# Patient Record
Sex: Male | Born: 1990 | Race: Black or African American | Hispanic: No | Marital: Single | Smoking: Current every day smoker
Health system: Southern US, Community
[De-identification: ages and names within clinical notes are randomized; demographics above are authoritative.]

## PROBLEM LIST (undated history)

## (undated) DIAGNOSIS — I609 Nontraumatic subarachnoid hemorrhage, unspecified: Secondary | ICD-10-CM

## (undated) DIAGNOSIS — W3400XA Accidental discharge from unspecified firearms or gun, initial encounter: Secondary | ICD-10-CM

---

## 2009-10-12 ENCOUNTER — Emergency Department (HOSPITAL_COMMUNITY): Admission: EM | Admit: 2009-10-12 | Discharge: 2009-10-12 | Payer: Self-pay | Admitting: Emergency Medicine

## 2013-03-15 ENCOUNTER — Emergency Department (HOSPITAL_COMMUNITY)
Admission: EM | Admit: 2013-03-15 | Discharge: 2013-03-15 | Disposition: A | Discharge status: Home / Self Care | Payer: Self-pay | Attending: Emergency Medicine | Admitting: Emergency Medicine

## 2013-03-15 ENCOUNTER — Encounter (HOSPITAL_COMMUNITY): Payer: Self-pay | Admitting: Emergency Medicine

## 2013-03-15 DIAGNOSIS — F17200 Nicotine dependence, unspecified, uncomplicated: Secondary | ICD-10-CM | POA: Insufficient documentation

## 2013-03-15 DIAGNOSIS — B9789 Other viral agents as the cause of diseases classified elsewhere: Secondary | ICD-10-CM | POA: Insufficient documentation

## 2013-03-15 DIAGNOSIS — R112 Nausea with vomiting, unspecified: Secondary | ICD-10-CM | POA: Insufficient documentation

## 2013-03-15 DIAGNOSIS — B349 Viral infection, unspecified: Secondary | ICD-10-CM

## 2013-03-15 LAB — RAPID STREP SCREEN (MED CTR MEBANE ONLY): Streptococcus, Group A Screen (Direct): NEGATIVE

## 2013-03-15 MED ORDER — ONDANSETRON HCL 4 MG PO TABS: 4.0000 mg | ORAL_TABLET | Freq: Four times a day (QID) | ORAL | Status: AC

## 2013-03-15 NOTE — ED Notes (Signed)
Pt c/o URI sx with cough and sore throat x 3 days 

## 2013-03-15 NOTE — ED Provider Notes (Signed)
CSN: [id number]     Arrival date & time 03/15/13  1248 History   First MD Initiated Contact with Patient 03/15/13 1305     Chief Complaint  Patient presents with  . Sore Throat  . URI   (Consider location/radiation/quality/duration/timing/severity/associated sxs/prior Treatment) HPI Comments: Patient presents to the emergency department with chief complaints of cough and sore throat. He states that he has been feeling sick for the past 3 days. He denies fevers, chills, diarrhea, or constipation. He states that he has had some mild nausea, as well as one episode of vomiting. He denies any hematemesis or hematochezia. States that he has not tried taking anything to alleviate his symptoms. He states that he is concerned that he might have strep throat. Nothing makes his symptoms better or worse.  The history is provided by the patient. No language interpreter was used.    History reviewed. No pertinent past medical history. History reviewed. No pertinent past surgical history. History reviewed. No pertinent family history. History  Substance Use Topics  . Smoking status: Current Every Day Smoker  . Smokeless tobacco: Not on file  . Alcohol Use: No    Review of Systems  All other systems reviewed and are negative.    Allergies  Review of patient's allergies indicates no known allergies.  Home Medications   Current Outpatient Rx  Name  Route  Sig  Dispense  Refill  . ondansetron (ZOFRAN) 4 MG tablet   Oral   Take 1 tablet (4 mg total) by mouth every 6 (six) hours.   12 tablet   0    BP 125/65  Pulse 91  Temp(Src) 98 F (36.7 C) (Oral)  Resp 20  SpO2 100% Physical Exam  Nursing note and vitals reviewed. Constitutional: He is oriented to person, place, and time. He appears well-developed and well-nourished.  HENT:  Head: Normocephalic and atraumatic.  Right Ear: External ear normal.  Left Ear: External ear normal.  Nose: Nose normal.  Mouth/Throat: Oropharynx is  clear and moist. No oropharyngeal exudate.  Mildly inflamed oropharynx, no exudates, no tonsillar or peritonsillar abscesses, uvula is midline, airway is intact  Eyes: Conjunctivae and EOM are normal. Pupils are equal, round, and reactive to light. Right eye exhibits no discharge. Left eye exhibits no discharge. No scleral icterus.  Neck: Normal range of motion. Neck supple. No JVD present.  Cardiovascular: Normal rate, regular rhythm, normal heart sounds and intact distal pulses.  Exam reveals no gallop and no friction rub.   No murmur heard. Pulmonary/Chest: Effort normal and breath sounds normal. No respiratory distress. He has no wheezes. He has no rales. He exhibits no tenderness.  Abdominal: Soft. Bowel sounds are normal. He exhibits no distension and no mass. There is no tenderness. There is no rebound and no guarding.  No focal abdominal tenderness, no pain at McBurney's point, no Murphy's sign, no signs of fluid wave or signs of peritonitis  Musculoskeletal: Normal range of motion. He exhibits no edema and no tenderness.  Neurological: He is alert and oriented to person, place, and time. He has normal reflexes.  CN 3-12 intact  Skin: Skin is warm and dry.  Psychiatric: He has a normal mood and affect. His behavior is normal. Judgment and thought content normal.    ED Course  Procedures (including critical care time) Labs Review Labs Reviewed  RAPID STREP SCREEN  CULTURE, GROUP A STREP   Imaging Review No results found.  EKG Interpretation   None  MDM   1. Viral syndrome    Patients symptoms are consistent with URI, likely viral etiology. Discussed that antibiotics are not indicated for viral infections. Pt will be discharged with symptomatic treatment.  Verbalizes understanding and is agreeable with plan. Pt is hemodynamically stable & in NAD prior to dc.  Patient has had some nausea and vomiting, however his abdomen is soft and nontender, no signs of surgical or  abdomen. Will give some Zofran. Patient is stable and ready for discharge. Return precautions are given.     Roxy Horseman, PA-C 03/15/13 1415

## 2013-03-15 NOTE — ED Provider Notes (Signed)
Medical screening examination/treatment/procedure(s) were performed by non-physician practitioner and as supervising physician I was immediately available for consultation/collaboration.  Flint Melter, MD 03/15/13 [phone]

## 2013-03-17 LAB — CULTURE, GROUP A STREP

## 2014-05-21 ENCOUNTER — Emergency Department (HOSPITAL_COMMUNITY): Payer: Self-pay

## 2014-05-21 ENCOUNTER — Encounter (HOSPITAL_COMMUNITY): Payer: Self-pay | Admitting: Emergency Medicine

## 2014-05-21 ENCOUNTER — Inpatient Hospital Stay (HOSPITAL_COMMUNITY)
Admission: EM | Admit: 2014-05-21 | Discharge: 2014-05-23 | DRG: 087 | Disposition: A | Discharge status: Home / Self Care | Payer: Self-pay | Attending: Neurosurgery | Admitting: Neurosurgery

## 2014-05-21 DIAGNOSIS — S0181XA Laceration without foreign body of other part of head, initial encounter: Secondary | ICD-10-CM | POA: Diagnosis present

## 2014-05-21 DIAGNOSIS — I609 Nontraumatic subarachnoid hemorrhage, unspecified: Secondary | ICD-10-CM

## 2014-05-21 DIAGNOSIS — W3400XA Accidental discharge from unspecified firearms or gun, initial encounter: Secondary | ICD-10-CM

## 2014-05-21 DIAGNOSIS — S066X0A Traumatic subarachnoid hemorrhage without loss of consciousness, initial encounter: Principal | ICD-10-CM | POA: Diagnosis present

## 2014-05-21 DIAGNOSIS — Z720 Tobacco use: Secondary | ICD-10-CM

## 2014-05-21 DIAGNOSIS — T1490XA Injury, unspecified, initial encounter: Secondary | ICD-10-CM

## 2014-05-21 DIAGNOSIS — F1290 Cannabis use, unspecified, uncomplicated: Secondary | ICD-10-CM | POA: Diagnosis present

## 2014-05-21 LAB — TYPE AND SCREEN
ABO/RH(D): A POS
Antibody Screen: NEGATIVE
Unit division: 0
Unit division: 0

## 2014-05-21 LAB — PROTIME-INR
INR: 1.02 (ref 0.00–1.49)
PROTHROMBIN TIME: 13.5 s (ref 11.6–15.2)

## 2014-05-21 LAB — CBC
HCT: 42 % (ref 39.0–52.0)
Hemoglobin: 15.3 g/dL (ref 13.0–17.0)
MCH: 31.4 pg (ref 26.0–34.0)
MCHC: 36.4 g/dL — AB (ref 30.0–36.0)
MCV: 86.2 fL (ref 78.0–100.0)
Platelets: 189 10*3/uL (ref 150–400)
RBC: 4.87 MIL/uL (ref 4.22–5.81)
RDW: 12.2 % (ref 11.5–15.5)
WBC: 6.9 10*3/uL (ref 4.0–10.5)

## 2014-05-21 LAB — COMPREHENSIVE METABOLIC PANEL
ALT: 13 U/L (ref 0–53)
AST: 21 U/L (ref 0–37)
Albumin: 4.2 g/dL (ref 3.5–5.2)
Alkaline Phosphatase: 69 U/L (ref 39–117)
Anion gap: 9 (ref 5–15)
BUN: 13 mg/dL (ref 6–23)
CHLORIDE: 105 meq/L (ref 96–112)
CO2: 22 mmol/L (ref 19–32)
CREATININE: 0.8 mg/dL (ref 0.50–1.35)
Calcium: 9.3 mg/dL (ref 8.4–10.5)
GFR calc Af Amer: >90 mL/min (ref >90)
Glucose, Bld: 113 mg/dL — ABNORMAL HIGH (ref 70–99)
Potassium: 3.6 mmol/L (ref 3.5–5.1)
Sodium: 136 mmol/L (ref 135–145)
Total Bilirubin: 1.3 mg/dL — ABNORMAL HIGH (ref 0.3–1.2)
Total Protein: 6.8 g/dL (ref 6.0–8.3)

## 2014-05-21 LAB — ETHANOL: Alcohol, Ethyl (B): <5 mg/dL (ref 0–9)

## 2014-05-21 LAB — ABO/RH: ABO/RH(D): A POS

## 2014-05-21 MED ORDER — MORPHINE SULFATE 2 MG/ML IJ SOLN
1.0000 mg | INTRAMUSCULAR | Status: DC | PRN
  Administered 2014-05-21 – 2014-05-22 (×2): 1 mg via INTRAVENOUS
  Filled 2014-05-21 (×2): qty 1

## 2014-05-21 MED ORDER — LIDOCAINE HCL 2 % IJ SOLN
20.0000 mL | Freq: Once | INTRAMUSCULAR | Status: AC
  Administered 2014-05-21: 400 mg via INTRADERMAL
  Filled 2014-05-21: qty 20

## 2014-05-21 MED ORDER — HYDROMORPHONE HCL 1 MG/ML IJ SOLN
1.0000 mg | Freq: Once | INTRAMUSCULAR | Status: AC
  Administered 2014-05-21: 1 mg via INTRAVENOUS
  Filled 2014-05-21: qty 1

## 2014-05-21 MED ORDER — ONDANSETRON HCL 4 MG/2ML IJ SOLN
4.0000 mg | Freq: Once | INTRAMUSCULAR | Status: AC
  Administered 2014-05-21: 4 mg via INTRAVENOUS
  Filled 2014-05-21: qty 2

## 2014-05-21 MED ORDER — CEFAZOLIN SODIUM 1-5 GM-% IV SOLN
1.0000 g | Freq: Once | INTRAVENOUS | Status: AC
  Administered 2014-05-21: 1 g via INTRAVENOUS
  Filled 2014-05-21: qty 50

## 2014-05-21 MED ORDER — SODIUM CHLORIDE 0.9 % IV SOLN
INTRAVENOUS | Status: DC
  Administered 2014-05-21 – 2014-05-23 (×4): via INTRAVENOUS

## 2014-05-21 MED ORDER — TETANUS-DIPHTH-ACELL PERTUSSIS 5-2.5-18.5 LF-MCG/0.5 IM SUSP
0.5000 mL | Freq: Once | INTRAMUSCULAR | Status: AC
  Administered 2014-05-21: 0.5 mL via INTRAMUSCULAR
  Filled 2014-05-21: qty 0.5

## 2014-05-21 MED ORDER — FENTANYL CITRATE 0.05 MG/ML IJ SOLN
50.0000 ug | Freq: Once | INTRAMUSCULAR | Status: AC
  Administered 2014-05-21: 50 ug via INTRAVENOUS
  Filled 2014-05-21: qty 2

## 2014-05-21 NOTE — ED Notes (Signed)
Emergency release blood at bedside 

## 2014-05-21 NOTE — ED Provider Notes (Signed)
LACERATION REPAIR Date/Time: 05/21/2014 5:34 PM Performed by: Elwin MochaWALDEN, Wesley Zavadil Authorized by: Elwin MochaWALDEN, Khloei Spiker Consent: Verbal consent obtained. Body area: head/neck Location details: scalp Laceration length: 13 cm Foreign bodies: no foreign bodies Tendon involvement: none Nerve involvement: none Vascular damage: no Anesthesia: local infiltration Local anesthetic: lidocaine 1% with epinephrine Anesthetic total: 5 ml Patient sedated: no Preparation: Patient was prepped and draped in the usual sterile fashion. Irrigation solution: saline Amount of cleaning: extensive Debridement: none Degree of undermining: none Skin closure: staples Number of sutures: 12 Technique: simple Approximation: close Approximation difficulty: simple Patient tolerance: Patient tolerated the procedure well with no immediate complications     Elwin MochaBlair Dellene Mcgroarty, MD 05/21/14 1735
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
## 2014-05-21 NOTE — ED Notes (Signed)
Neurosurgery at beside.

## 2014-05-21 NOTE — ED Notes (Signed)
[employer] at bedside. Chaplain notified RN that pt's family is present. 2 family members brought back to room with pt.

## 2014-05-21 NOTE — Progress Notes (Signed)
Chaplain was paged back to by the ED front desk regarding more people arriving for the patient. Chaplain consulted with the family already present and in consultation room B regarding the additional people showing up for the patient. Patient's family indicated that they did really know the additional people showing up to visit with the patient but figured they were the patient's friends. Patient's family consented that if the patient's girlfriend and another specific friend they named were present that they could come back to be with them. Chaplain spoke to the persons who were here to see the patient in the lobby.  Chaplain let these persons know that they would not all be able to come back into the ED.  Chaplain showed patient's girlfriend and another friend to consultation room B. Patient's girlfriend is at bedside currently. Page Merrilyn Puman-Call Chaplain if further support needed. Bevan Vu, Tommi EmeryBlake R, Chaplain  2:51 PM

## 2014-05-21 NOTE — ED Notes (Signed)
Pt. Is resting comfortablly, denies any pain .  Nausea is relieved .

## 2014-05-21 NOTE — ED Notes (Signed)
Neuro surgery paged at 1610. MD at bedside and updated family on pt's CT scan.

## 2014-05-21 NOTE — ED Notes (Signed)
X-ray at bedside

## 2014-05-21 NOTE — ED Notes (Signed)
Patient can eat per MD Wesley Dawson
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
## 2014-05-21 NOTE — ED Notes (Signed)
MD at bedside with suture cart to suture pt's lac on left side of head.

## 2014-05-21 NOTE — ED Notes (Signed)
Pt undressed and placed in gown and on monitor
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
## 2014-05-22 ENCOUNTER — Inpatient Hospital Stay (HOSPITAL_COMMUNITY): Payer: Self-pay

## 2014-05-22 LAB — MRSA PCR SCREENING: MRSA BY PCR: NEGATIVE

## 2014-05-22 MED ORDER — ONDANSETRON HCL 4 MG/2ML IJ SOLN: 4.0000 mg | Freq: Four times a day (QID) | INTRAMUSCULAR | Status: DC | PRN

## 2014-05-22 MED ORDER — OXYCODONE HCL 5 MG PO TABS
5.0000 mg | ORAL_TABLET | ORAL | Status: DC | PRN
  Administered 2014-05-22 – 2014-05-23 (×6): 5 mg via ORAL
  Filled 2014-05-22 (×6): qty 1

## 2014-05-22 NOTE — Progress Notes (Signed)
Pt transferred to 4N31- VSS CMT and elink notified.

## 2014-05-22 NOTE — Progress Notes (Signed)
Patient ID: Wesley Dawson, male   DOB: 03/02/1991, 24 y.o.   MRN: [id number] Stable, neuro no findings except for complain of headache. Ct head decrease of bleeding.

## 2014-05-23 ENCOUNTER — Other Ambulatory Visit: Payer: Self-pay | Admitting: Neurosurgery

## 2014-05-23 LAB — PREPARE FRESH FROZEN PLASMA

## 2014-05-23 LAB — BLOOD PRODUCT ORDER (VERBAL) VERIFICATION

## 2014-05-23 MED ORDER — OXYCODONE HCL 5 MG PO TABS: 5.0000 mg | ORAL_TABLET | ORAL | Status: AC | PRN
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
## 2014-05-23 NOTE — Progress Notes (Signed)
Patient does not have a PCP and no medical insurance; talked to patient and he is agreeable [facility]; Apt made for May 25, 2014 at 9 am; [place] [facility]; Abelino DerrickB Lillianah Swartzentruber RN,BSN,MHA [phone]

## 2014-05-23 NOTE — Progress Notes (Signed)
Patient is discharged from room 4N31 at this time. Alert and in stable condition. IV site d/c'd and instructions read to patient with understanding verbalized. Pharmacy was able to reconcile orders which enables me to print AVS.

## 2014-05-25 ENCOUNTER — Inpatient Hospital Stay: Payer: Self-pay

## 2014-06-01 ENCOUNTER — Other Ambulatory Visit: Payer: Self-pay | Admitting: Neurosurgery

## 2014-06-01 DIAGNOSIS — S0990XD Unspecified injury of head, subsequent encounter: Secondary | ICD-10-CM

## 2014-06-03 ENCOUNTER — Inpatient Hospital Stay: Admission: RE | Admit: 2014-06-03 | Payer: Self-pay | Source: Ambulatory Visit

## 2014-06-16 ENCOUNTER — Emergency Department (HOSPITAL_COMMUNITY): Admission: EM | Admit: 2014-06-16 | Discharge: 2014-06-16 | Discharge status: Absent without leave | Payer: Self-pay

## 2014-06-16 ENCOUNTER — Emergency Department (INDEPENDENT_AMBULATORY_CARE_PROVIDER_SITE_OTHER)
Admission: EM | Admit: 2014-06-16 | Discharge: 2014-06-16 | Disposition: A | Discharge status: Home / Self Care | Payer: Self-pay | Source: Home / Self Care | Attending: Family Medicine | Admitting: Family Medicine

## 2014-06-16 ENCOUNTER — Encounter (HOSPITAL_COMMUNITY): Payer: Self-pay | Admitting: Emergency Medicine

## 2014-06-16 DIAGNOSIS — Z4802 Encounter for removal of sutures: Secondary | ICD-10-CM

## 2014-06-16 DIAGNOSIS — G4489 Other headache syndrome: Secondary | ICD-10-CM
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
## 2014-06-16 NOTE — ED Notes (Signed)
Pt states that the area is still tender to touch. Mw,cma

## 2014-06-16 NOTE — Discharge Instructions (Signed)
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
## 2014-07-15 ENCOUNTER — Emergency Department (HOSPITAL_COMMUNITY)
Admission: EM | Admit: 2014-07-15 | Discharge: 2014-07-15 | Discharge status: Against Medical Advice | Payer: Self-pay | Attending: Emergency Medicine | Admitting: Emergency Medicine

## 2014-07-15 ENCOUNTER — Encounter (HOSPITAL_COMMUNITY): Payer: Self-pay

## 2014-07-15 DIAGNOSIS — Y9289 Other specified places as the place of occurrence of the external cause: Secondary | ICD-10-CM | POA: Insufficient documentation

## 2014-07-15 DIAGNOSIS — Z202 Contact with and (suspected) exposure to infections with a predominantly sexual mode of transmission: Secondary | ICD-10-CM | POA: Insufficient documentation

## 2014-07-15 DIAGNOSIS — Y998 Other external cause status: Secondary | ICD-10-CM | POA: Insufficient documentation

## 2014-07-15 DIAGNOSIS — S0990XA Unspecified injury of head, initial encounter: Secondary | ICD-10-CM | POA: Insufficient documentation

## 2014-07-15 DIAGNOSIS — X58XXXA Exposure to other specified factors, initial encounter: Secondary | ICD-10-CM | POA: Insufficient documentation

## 2014-07-15 DIAGNOSIS — Y9389 Activity, other specified: Secondary | ICD-10-CM | POA: Insufficient documentation

## 2014-07-15 HISTORY — DX: Accidental discharge from unspecified firearms or gun, initial encounter: W34.00XA

## 2014-07-15 HISTORY — DX: Nontraumatic subarachnoid hemorrhage, unspecified: I60.9

## 2014-07-15 NOTE — ED Provider Notes (Signed)
This patient left without being seen after triage. I did not participate in the care of this patient.  Wynetta Emeryicole Deklan Minar, PA-C 07/15/14 1717  Gilda Creasehristopher J Pollina, MD 07/15/14 [phone]
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
## 2014-11-18 ENCOUNTER — Encounter (HOSPITAL_COMMUNITY): Payer: Self-pay
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
